# Patient Record
Sex: Female | Born: 1985 | Race: Black or African American | Hispanic: No | Marital: Single | Smoking: Current every day smoker
Health system: Southern US, Community
[De-identification: ages and names within clinical notes are randomized; demographics above are authoritative.]

---

## 2015-07-02 ENCOUNTER — Encounter (HOSPITAL_COMMUNITY): Payer: Self-pay | Admitting: Emergency Medicine

## 2015-07-02 ENCOUNTER — Emergency Department (HOSPITAL_COMMUNITY)
Admission: EM | Admit: 2015-07-02 | Discharge: 2015-07-02 | Disposition: A | Discharge status: Home / Self Care | Payer: Medicaid Other | Attending: Emergency Medicine | Admitting: Emergency Medicine

## 2015-07-02 DIAGNOSIS — F17200 Nicotine dependence, unspecified, uncomplicated: Secondary | ICD-10-CM | POA: Insufficient documentation

## 2015-07-02 DIAGNOSIS — H9202 Otalgia, left ear: Secondary | ICD-10-CM | POA: Diagnosis present

## 2015-07-02 DIAGNOSIS — H6092 Unspecified otitis externa, left ear: Secondary | ICD-10-CM

## 2015-07-02 MED ORDER — OFLOXACIN 0.3 % OT SOLN: 10.0000 [drp] | Freq: Two times a day (BID) | OTIC | Status: DC

## 2015-07-02 NOTE — Discharge Instructions (Signed)
Please read and follow all provided instructions.  Your diagnoses today include:  1. Otitis externa, left    Tests performed today include:  Vital signs. See below for your results today.   Medications prescribed:   Take as prescribed   Home care instructions:  Follow any educational materials contained in this packet.  Follow-up instructions: Please follow-up with your primary care provider in the next week for further evaluation of symptoms and treatment   Return instructions:   Please return to the Emergency Department if you do not get better, if you get worse, or new symptoms OR  - Fever (temperature greater than 101.1F)  - Bleeding that does not stop with holding pressure to the area    -Severe pain (please note that you may be more sore the day after your accident)  - Chest Pain  - Difficulty breathing  - Severe nausea or vomiting  - Inability to tolerate food and liquids  - Passing out  - Skin becoming red around your wounds  - Change in mental status (confusion or lethargy)  - New numbness or weakness     Please return if you have any other emergent concerns.  Additional Information:  Your vital signs today were: BP 143/94 mmHg   Pulse 72   Temp(Src) 98.1 F (36.7 C) (Oral)   Resp 16   Ht 5\' 9"  (1.753 m)   Wt 91.627 kg   BMI 29.82 kg/m2   SpO2 96%   LMP 06/18/2015 (Approximate) If your blood pressure (BP) was elevated above 135/85 this visit, please have this repeated by your doctor within one month. ---------------

## 2015-07-02 NOTE — ED Provider Notes (Signed)
CSN: [id number]     Arrival date & time 07/02/15  0631 History   None    Chief Complaint  Patient presents with  . Otalgia   (Consider location/radiation/quality/duration/timing/severity/associated sxs/prior Treatment) HPI 30 y.o. female presents to the Emergency Department today complaining of left ear pain since yesterday. Notes pain is 10/10 and sharp. No fevers. No N/V/D. Has tried OTC Ibuprofen with relief. No N/V/D. No Cp/SOB/ABD pain. No decrease in hearing. No other symptoms noted.   History reviewed. No pertinent past medical history. History reviewed. No pertinent past surgical history. No family history on file. Social History  Substance Use Topics  . Smoking status: Current Every Day Smoker  . Smokeless tobacco: None  . Alcohol Use: No   OB History    No data available     Review of Systems ROS reviewed and all are negative for acute change except as noted in the HPI.  Allergies  Review of patient's allergies indicates no known allergies.  Home Medications   Prior to Admission medications   Not on File   BP 143/94 mmHg  Pulse 72  Temp(Src) 98.1 F (36.7 C) (Oral)  Resp 16  Ht 5\' 9"  (1.753 m)  Wt 91.627 kg  BMI 29.82 kg/m2  SpO2 96%  LMP 06/18/2015 (Approximate) Physical Exam  Constitutional: She is oriented to person, place, and time. She appears well-developed and well-nourished.  HENT:  Head: Normocephalic and atraumatic.  Left Ear: Hearing and tympanic membrane normal. There is swelling and tenderness. No drainage. Tympanic membrane is not injected, not scarred, not perforated and not erythematous.  Otitis Externa noted   Eyes: EOM are normal.  Cardiovascular: Normal rate and regular rhythm.   Pulmonary/Chest: Effort normal.  Abdominal: Soft.  Musculoskeletal: Normal range of motion.  Neurological: She is alert and oriented to person, place, and time.  Skin: Skin is warm and dry.  Psychiatric: She has a normal mood and affect. Her behavior is  normal. Thought content normal.  Nursing note and vitals reviewed.  ED Course  Procedures (including critical care time) Labs Review Labs Reviewed - No data to display  Imaging Review No results found. I have personally reviewed and evaluated these images and lab results as part of my medical decision-making.   EKG Interpretation None      MDM  I have reviewed the relevant previous healthcare records. I obtained HPI from historian.  ED Course:  Assessment: Pt is a 29yF who presents with Otitis Externa. On exam, pt in NAD. Nontoxic/nonseptic appearing. VSS. Afebrile. TMs clear. No Mastoid tenderness. Plan is to DC home with otic abx drops and symptomatic tx. At time of discharge, Patient is in no acute distress. Vital Signs are stable. Patient is able to ambulate. Patient able to tolerate PO.   Disposition/Plan:  Dc Home Additional Verbal discharge instructions given and discussed with patient.  Pt Instructed to f/u with PCP in the next 48 hours for evaluation and treatment of symptoms. Return precautions given Pt acknowledges and agrees with plan  Supervising Physician No att. providers found   Final diagnoses:  Otitis externa, left     Audry Piliyler Christobal Morado, PA-C 07/02/15 0751  Melene Planan Floyd, DO 07/02/15 1459

## 2015-07-02 NOTE — ED Notes (Signed)
Pt. reports left ear ache onset yesterday , denies injury , no drainage or hearing loss.  

## 2015-07-26 ENCOUNTER — Encounter (HOSPITAL_COMMUNITY): Payer: Self-pay | Admitting: Emergency Medicine

## 2015-07-26 ENCOUNTER — Emergency Department (HOSPITAL_COMMUNITY)
Admission: EM | Admit: 2015-07-26 | Discharge: 2015-07-26 | Disposition: A | Discharge status: Home / Self Care | Payer: Medicaid Other | Attending: Emergency Medicine | Admitting: Emergency Medicine

## 2015-07-26 DIAGNOSIS — H6505 Acute serous otitis media, recurrent, left ear: Secondary | ICD-10-CM | POA: Diagnosis not present

## 2015-07-26 DIAGNOSIS — H9202 Otalgia, left ear: Secondary | ICD-10-CM | POA: Diagnosis present

## 2015-07-26 DIAGNOSIS — F17200 Nicotine dependence, unspecified, uncomplicated: Secondary | ICD-10-CM | POA: Diagnosis not present

## 2015-07-26 MED ORDER — AMOXICILLIN 500 MG PO CAPS: 500.0000 mg | ORAL_CAPSULE | Freq: Three times a day (TID) | ORAL | Status: DC

## 2015-07-26 NOTE — ED Notes (Signed)
Patient able to ambulate independently  

## 2015-07-26 NOTE — Discharge Instructions (Signed)

## 2015-07-26 NOTE — ED Provider Notes (Signed)
CSN: [id number]     Arrival date & time 07/26/15  0002 History   First MD Initiated Contact with Patient 07/26/15 0107     Chief Complaint  Patient presents with  . Otalgia     (Consider location/radiation/quality/duration/timing/severity/associated sxs/prior Treatment) Patient is a 30 y.o. female presenting with ear pain. The history is provided by the patient. No language interpreter was used.  Otalgia Location:  Left Quality:  Aching and pressure Severity:  Moderate Onset quality:  Gradual Duration:  2 days Timing:  Intermittent Progression:  Worsening Chronicity:  Recurrent Ineffective treatments:  OTC medications Associated symptoms: no congestion, no cough, no fever and no rhinorrhea     History reviewed. No pertinent past medical history. History reviewed. No pertinent past surgical history. No family history on file. Social History  Substance Use Topics  . Smoking status: Current Every Day Smoker  . Smokeless tobacco: None  . Alcohol Use: No   OB History    No data available     Review of Systems  Constitutional: Negative for fever.  HENT: Positive for ear pain. Negative for congestion and rhinorrhea.   Respiratory: Negative for cough.   All other systems reviewed and are negative.     Allergies  Review of patient's allergies indicates no known allergies.  Home Medications   Prior to Admission medications   Medication Sig Start Date End Date Taking? Authorizing Provider  ofloxacin (FLOXIN) 0.3 % otic solution Place 10 drops into the left ear 2 (two) times daily. For 7 days 07/02/15   Audry Piliyler Mohr, PA-C   BP 132/85 mmHg  Pulse 89  Temp(Src) 98.2 F (36.8 C) (Oral)  Resp 16  Ht 5\' 9"  (1.753 m)  Wt 91.315 kg  BMI 29.72 kg/m2  SpO2 100%  LMP 07/18/2015 Physical Exam  Constitutional: She is oriented to person, place, and time. She appears well-developed and well-nourished.  HENT:  Head: Normocephalic.  Right Ear: Tympanic membrane normal.  Left  Ear: Tympanic membrane is bulging.  Eyes: Pupils are equal, round, and reactive to light.  Neck: Normal range of motion. Neck supple.  Cardiovascular: Normal rate and regular rhythm.   Pulmonary/Chest: Effort normal and breath sounds normal.  Abdominal: Soft.  Musculoskeletal: She exhibits no edema or tenderness.  Lymphadenopathy:    She has no cervical adenopathy.  Neurological: She is alert and oriented to person, place, and time.  Skin: Skin is warm and dry.  Psychiatric: She has a normal mood and affect.  Nursing note and vitals reviewed.   ED Course  Procedures (including critical care time) Labs Review Labs Reviewed - No data to display  Imaging Review No results found. I have personally reviewed and evaluated these images and lab results as part of my medical decision-making.   EKG Interpretation None      MDM   Final diagnoses:  None   Pt presenting with otitis media. Pt afebrile in NAD. Exam not concerning for mastoiditis or cellulitis. Discharge with amoxicillin  Advised follow up  in 2-3 days if no improvement.  Return precautions discussed. Pt appears safe for discharge.     Felicie Mornavid Amadeus Oyama, NP 07/26/15 [phone]  Shon Batonourtney F Horton, MD 07/26/15 2255

## 2015-07-26 NOTE — ED Notes (Signed)
Pt. reports left earache onset yesterday with mild drainage , denies fever or hearing loss.

## 2015-07-29 ENCOUNTER — Encounter (HOSPITAL_COMMUNITY): Payer: Self-pay | Admitting: Emergency Medicine

## 2015-07-29 ENCOUNTER — Ambulatory Visit (HOSPITAL_COMMUNITY)
Admission: EM | Admit: 2015-07-29 | Discharge: 2015-07-29 | Disposition: A | Discharge status: Home / Self Care | Payer: Medicaid Other | Attending: Family Medicine | Admitting: Family Medicine

## 2015-07-29 DIAGNOSIS — H9202 Otalgia, left ear: Secondary | ICD-10-CM

## 2015-07-29 DIAGNOSIS — H6692 Otitis media, unspecified, left ear: Secondary | ICD-10-CM

## 2015-07-29 MED ORDER — ACETAMINOPHEN-CODEINE #3 300-30 MG PO TABS: 1.0000 | ORAL_TABLET | Freq: Four times a day (QID) | ORAL | Status: AC | PRN

## 2015-07-29 NOTE — ED Notes (Signed)
Patient reports ear issue and pain for 3 weeks.  Patient has been seen at [facility] emergency department for the same and no improvement per patient and latest symptoms is visible swelling to left side of head.

## 2015-07-29 NOTE — Discharge Instructions (Signed)
Otitis Media, Adult °Otitis media is redness, soreness, and inflammation of the middle ear. Otitis media may be caused by allergies or, most commonly, by infection. Often it occurs as a complication of the common cold. °SIGNS AND SYMPTOMS °Symptoms of otitis media may include: °· Earache. °· Fever. °· Ringing in your ear. °· Headache. °· Leakage of fluid from the ear. °DIAGNOSIS °To diagnose otitis media, your health care provider will examine your ear with an otoscope. This is an instrument that allows your health care provider to see into your ear in order to examine your eardrum. Your health care provider also will ask you questions about your symptoms. °TREATMENT  °Typically, otitis media resolves on its own within 3-5 days. Your health care provider may prescribe medicine to ease your symptoms of pain. If otitis media does not resolve within 5 days or is recurrent, your health care provider may prescribe antibiotic medicines if he or she suspects that a bacterial infection is the cause. °HOME CARE INSTRUCTIONS  °· If you were prescribed an antibiotic medicine, finish it all even if you start to feel better. °· Take medicines only as directed by your health care provider. °· Keep all follow-up visits as directed by your health care provider. °SEEK MEDICAL CARE IF: °· You have otitis media only in one ear, or bleeding from your nose, or both. °· You notice a lump on your neck. °· You are not getting better in 3-5 days. °· You feel worse instead of better. °SEEK IMMEDIATE MEDICAL CARE IF:  °· You have pain that is not controlled with medicine. °· You have swelling, redness, or pain around your ear or stiffness in your neck. °· You notice that part of your face is paralyzed. °· You notice that the bone behind your ear (mastoid) is tender when you touch it. °MAKE SURE YOU:  °· Understand these instructions. °· Will watch your condition. °· Will get help right away if you are not doing well or get worse. °  °This  information is not intended to replace advice given to you by your health care provider. Make sure you discuss any questions you have with your health care provider. °  °Document Released: 12/06/2003 Document Revised: 03/23/2014 Document Reviewed: 09/27/2012 °Elsevier Interactive Patient Education ©2016 [employer] ° °Earache °An earache, also called otalgia, can be caused by many things. Pain from an earache can be sharp, dull, or burning. The pain may be temporary or constant. °Earaches can be caused by problems with the ear, such as infection in either the middle ear or the ear canal, injury, impacted ear wax, middle ear pressure, or a foreign body in the ear. Ear pain can also result from problems in other areas. This is called referred pain. For example, pain can come from a sore throat, a tooth infection, or problems with the jaw or the joint between the jaw and the skull (temporomandibular joint, or TMJ). °The cause of an earache is not always easy to identify. Watchful waiting may be appropriate for some earaches until a clear cause of the pain can be found. °HOME CARE INSTRUCTIONS °Watch your condition for any changes. The following actions may help to lessen any discomfort that you are feeling: °· Take medicines only as directed by your health care provider. This includes ear drops. °· Apply ice to your outer ear to help reduce pain. °¨ Put ice in a plastic bag. °¨ Place a towel between your skin and the bag. °¨ Leave the   ice on for 20 minutes, 2-3 times per day. °· Do not put anything in your ear other than medicine that is prescribed by your health care provider. °· Try resting in an upright position instead of lying down. This may help to reduce pressure in the middle ear and relieve pain. °· Chew gum if it helps to relieve your ear pain. °· Control any allergies that you have. °· Keep all follow-up visits as directed by your health care provider. This is important. °SEEK MEDICAL CARE IF: °· Your pain  does not improve within 2 days. °· You have a fever. °· You have new or worsening symptoms. °SEEK IMMEDIATE MEDICAL CARE IF: °· You have a severe headache. °· You have a stiff neck. °· You have difficulty swallowing. °· You have redness or swelling behind your ear. °· You have drainage from your ear. °· You have hearing loss. °· You feel dizzy. °  °This information is not intended to replace advice given to you by your health care provider. Make sure you discuss any questions you have with your health care provider. °  °Document Released: 10/18/2003 Document Revised: 03/23/2014 Document Reviewed: 10/01/2013 °Elsevier Interactive Patient Education ©2016 [employer] ° °

## 2015-07-29 NOTE — ED Provider Notes (Signed)
CSN: [id number]     Arrival date & time 07/29/15  1425 History   First MD Initiated Contact with Patient 07/29/15 1550     Chief Complaint  Patient presents with  . Otalgia   (Consider location/radiation/quality/duration/timing/severity/associated sxs/prior Treatment) HPI History obtained from patient:  Pt presents with the cc of: Left ear pain Duration of symptoms: 3 weeks Treatment prior to arrival: Treated once with eardrops and now amoxicillin, ibuprofen Context: Earache onset 3 weeks ago was seen in the emergency department diagnoses with swimmer's ear and was given drops that Better and now she has here pain again was seen in the emergency department Friday night was diagnosed with otitis media and was given amoxicillin states pain is no better. States she has been taking ibuprofen without any significant symptom relief. Other symptoms include: Pain in the Pain score: 6 FAMILY HISTORY: No family history of cancer. SOCIAL HISTORY: Current smoker  History reviewed. No pertinent past medical history. History reviewed. No pertinent past surgical history. No family history on file. Social History  Substance Use Topics  . Smoking status: Current Every Day Smoker  . Smokeless tobacco: None  . Alcohol Use: No   OB History    No data available     Review of Systems ROS +'ve left earache  Denies: HEADACHE, NAUSEA, ABDOMINAL PAIN, CHEST PAIN, CONGESTION, DYSURIA, SHORTNESS OF BREATH  Allergies  Review of patient's allergies indicates no known allergies.  Home Medications   Prior to Admission medications   Medication Sig Start Date End Date Taking? Authorizing Provider  ibuprofen (ADVIL,MOTRIN) 200 MG tablet Take 200 mg by mouth every 6 (six) hours as needed.   Yes Historical Provider, MD  acetaminophen-codeine (TYLENOL #3) 300-30 MG tablet Take 1-2 tablets by mouth every 6 (six) hours as needed for moderate pain. 07/29/15   Tharon Aquas, PA  amoxicillin (AMOXIL) 500 MG  capsule Take 1 capsule (500 mg total) by mouth 3 (three) times daily. 07/26/15   Felicie Morn, NP  ofloxacin (FLOXIN) 0.3 % otic solution Place 10 drops into the left ear 2 (two) times daily. For 7 days 07/02/15   Audry Pili, PA-C   Meds Ordered and Administered this Visit  Medications - No data to display  BP 130/77 mmHg  Pulse 96  Temp(Src) 98.2 F (36.8 C) (Oral)  Resp 12  SpO2 100%  LMP 07/18/2015 No data found.   Physical Exam NURSES NOTES AND VITAL SIGNS REVIEWED. CONSTITUTIONAL: Well developed, well nourished, no acute distress HEENT: normocephalic, atraumatic left ear there is effusion noted behind the membrane. There is some bulging noted minimal redness. No motion. Right ear is clear and normal. EYES: Conjunctiva normal NECK:normal ROM, supple, no adenopathy PULMONARY:No respiratory distress, normal effort ABDOMINAL: Soft, ND, NT BS+, No CVAT MUSCULOSKELETAL: Normal ROM of all extremities,  SKIN: warm and dry without rash PSYCHIATRIC: Mood and affect, behavior are normal  ED Course  Procedures (including critical care time)  Labs Review Labs Reviewed - No data to display  Imaging Review No results found.   Visual Acuity Review  Right Eye Distance:   Left Eye Distance:   Bilateral Distance:    Right Eye Near:   Left Eye Near:    Bilateral Near:      Prescription for Tylenol No. 3 No. 30 provided   MDM   1. Otalgia of left ear   2. Acute left otitis media, recurrence not specified, unspecified otitis media type     Patient is reassured that there  are no issues that require transfer to higher level of care at this time or additional tests. Patient is advised to continue home symptomatic treatment. Patient is advised that if there are new or worsening symptoms to attend the emergency department, contact primary care provider, or return to UC. Instructions of care provided discharged home in stable condition.    THIS NOTE WAS GENERATED USING A VOICE  RECOGNITION SOFTWARE PROGRAM. ALL REASONABLE EFFORTS  WERE MADE TO PROOFREAD THIS DOCUMENT FOR ACCURACY.  I have verbally reviewed the discharge instructions with the patient. A printed AVS was given to the patient.  All questions were answered prior to discharge.      Tharon AquasFrank C Patrick, PA 07/29/15 1627

## 2015-08-05 ENCOUNTER — Encounter (HOSPITAL_COMMUNITY): Payer: Self-pay

## 2015-08-05 ENCOUNTER — Emergency Department (HOSPITAL_COMMUNITY)
Admission: EM | Admit: 2015-08-05 | Discharge: 2015-08-05 | Disposition: A | Discharge status: Home / Self Care | Payer: Medicaid Other | Attending: Emergency Medicine | Admitting: Emergency Medicine

## 2015-08-05 DIAGNOSIS — T43615A Adverse effect of caffeine, initial encounter: Secondary | ICD-10-CM | POA: Insufficient documentation

## 2015-08-05 DIAGNOSIS — R51 Headache: Secondary | ICD-10-CM | POA: Diagnosis present

## 2015-08-05 DIAGNOSIS — F17210 Nicotine dependence, cigarettes, uncomplicated: Secondary | ICD-10-CM | POA: Diagnosis not present

## 2015-08-05 DIAGNOSIS — G4440 Drug-induced headache, not elsewhere classified, not intractable: Secondary | ICD-10-CM | POA: Diagnosis not present

## 2015-08-05 DIAGNOSIS — Z792 Long term (current) use of antibiotics: Secondary | ICD-10-CM | POA: Insufficient documentation

## 2015-08-05 DIAGNOSIS — T3995XA Adverse effect of unspecified nonopioid analgesic, antipyretic and antirheumatic, initial encounter: Secondary | ICD-10-CM

## 2015-08-05 MED ORDER — DEXAMETHASONE SODIUM PHOSPHATE 10 MG/ML IJ SOLN: 10.0000 mg | Freq: Once | INTRAMUSCULAR | Status: DC

## 2015-08-05 MED ORDER — METOCLOPRAMIDE HCL 10 MG PO TABS
10.0000 mg | ORAL_TABLET | Freq: Once | ORAL | Status: AC
  Administered 2015-08-05: 10 mg via ORAL
  Filled 2015-08-05: qty 1

## 2015-08-05 MED ORDER — METOCLOPRAMIDE HCL 5 MG/ML IJ SOLN: 10.0000 mg | Freq: Once | INTRAMUSCULAR | Status: DC

## 2015-08-05 MED ORDER — DIPHENHYDRAMINE HCL 25 MG PO CAPS
50.0000 mg | ORAL_CAPSULE | Freq: Once | ORAL | Status: AC
  Administered 2015-08-05: 50 mg via ORAL
  Filled 2015-08-05: qty 2

## 2015-08-05 MED ORDER — DIPHENHYDRAMINE HCL 50 MG/ML IJ SOLN: 25.0000 mg | Freq: Once | INTRAMUSCULAR | Status: DC

## 2015-08-05 MED ORDER — IBUPROFEN 600 MG PO TABS: 600.0000 mg | ORAL_TABLET | Freq: Four times a day (QID) | ORAL | Status: AC | PRN

## 2015-08-05 MED ORDER — IBUPROFEN 800 MG PO TABS
800.0000 mg | ORAL_TABLET | Freq: Once | ORAL | Status: AC
  Administered 2015-08-05: 800 mg via ORAL
  Filled 2015-08-05: qty 1

## 2015-08-05 MED ORDER — KETOROLAC TROMETHAMINE 30 MG/ML IJ SOLN: 30.0000 mg | Freq: Once | INTRAMUSCULAR | Status: DC

## 2015-08-05 NOTE — ED Notes (Signed)
MD at bedside. 

## 2015-08-05 NOTE — ED Provider Notes (Signed)
CSN: [id number]     Arrival date & time 08/05/15  [id number] History   First MD Initiated Contact with Patient 08/05/15 [phone]     Chief Complaint  Patient presents with  . Headache     (Consider location/radiation/quality/duration/timing/severity/associated sxs/prior Treatment) Patient is a 30 y.o. female presenting with headaches. The history is provided by the patient.  Headache Pain location:  L temporal Quality: tightness. Radiates to:  Does not radiate Onset quality:  Gradual Duration:  2 days Timing:  Constant Progression:  Waxing and waning Chronicity:  New Similar to prior headaches: no   Context: loud noise   Context: not activity   Relieved by:  Nothing Worsened by:  Nothing Ineffective treatments:  None tried Associated symptoms: no cough, no fever, no hearing loss, no loss of balance and no vomiting     History reviewed. No pertinent past medical history. History reviewed. No pertinent past surgical history. History reviewed. No pertinent family history. Social History  Substance Use Topics  . Smoking status: Current Every Day Smoker  . Smokeless tobacco: None  . Alcohol Use: No   OB History    No data available     Review of Systems  Constitutional: Negative for fever.  HENT: Negative for hearing loss.   Respiratory: Negative for cough.   Gastrointestinal: Negative for vomiting.  Neurological: Positive for headaches. Negative for loss of balance.  All other systems reviewed and are negative.     Allergies  Review of patient's allergies indicates no known allergies.  Home Medications   Prior to Admission medications   Medication Sig Start Date End Date Taking? Authorizing Provider  acetaminophen-codeine (TYLENOL #3) 300-30 MG tablet Take 1-2 tablets by mouth every 6 (six) hours as needed for moderate pain. 07/29/15   Tharon AquasFrank C Patrick, PA  amoxicillin (AMOXIL) 500 MG capsule Take 1 capsule (500 mg total) by mouth 3 (three) times daily. 07/26/15   Felicie Mornavid  Smith, NP  ibuprofen (ADVIL,MOTRIN) 200 MG tablet Take 200 mg by mouth every 6 (six) hours as needed.    Historical Provider, MD  ofloxacin (FLOXIN) 0.3 % otic solution Place 10 drops into the left ear 2 (two) times daily. For 7 days 07/02/15   Audry Piliyler Mohr, PA-C   BP 146/95 mmHg  Pulse 102  Temp(Src) 98.3 F (36.8 C) (Oral)  Resp 20  Ht 5\' 9"  (1.753 m)  Wt 202 lb (91.627 kg)  BMI 29.82 kg/m2  SpO2 100%  LMP 07/18/2015 Physical Exam  Constitutional: She is oriented to person, place, and time. She appears well-developed and well-nourished. No distress.  HENT:  Head: Normocephalic.  Eyes: Conjunctivae are normal.  Neck: Neck supple. No tracheal deviation present.  Cardiovascular: Normal rate and regular rhythm.   Pulmonary/Chest: Effort normal. No respiratory distress.  Abdominal: Soft. She exhibits no distension.  Neurological: She is alert and oriented to person, place, and time.  Normal finger to nose testing and rapid alternating movement   Skin: Skin is warm and dry.  Psychiatric: She has a normal mood and affect.  Vitals reviewed.   ED Course  Procedures (including critical care time) Labs Review Labs Reviewed - No data to display  Imaging Review No results found. I have personally reviewed and evaluated these images and lab results as part of my medical decision-making.   EKG Interpretation None      MDM   Final diagnoses:  Analgesic rebound headache    30 y.o. female presents with Headache after having a left otitis  media and otitis externa infection diagnosed earlier this month. She's been taking Goody powders, generic Tylenol, aspirin and caffeine-containing medication and Tylenol with codeine which have seemingly exacerbated her symptoms. I discussed the strong possibility of rebound headache in setting of codeine and caffeine use for headache control. Neurologically intact with no red flags. Provided a migraine cocktail here for help with immediately relieving  pain and will plan for monotherapy with ibuprofen at home for any continuing symptoms. She is to abstain from Goody's powders and medications with caffeine or narcotic agents in them.    Lyndal Pulley, MD 08/05/15 [phone]

## 2015-08-05 NOTE — ED Notes (Signed)
Pt complains of a headache since Saturday, she had an earache infection prior to that

## 2015-08-05 NOTE — ED Notes (Signed)
Discharge instructions, follow up care, and rx x1 reviewed with patient. Patient verbalized understanding. 

## 2015-08-05 NOTE — Discharge Instructions (Signed)
Analgesic Rebound Headaches An analgesic rebound headache is a headache that returns after pain medicine (analgesic) that was taken to treat the initial headache wears off. People who suffer from tension, migraine, or cluster headaches are at risk for developing rebound headaches. Any type of primary headache can return as a rebound headache if you regularly take analgesics more than three times a week. If the cycle of rebound headaches continues, they become chronic daily headaches.  CAUSES Analgesics frequently associated with this problem include common over-the-counter medicines like aspirin, ibuprofen, acetaminophen, sinus relief medicines, and other medicines that contain caffeine. Narcotic pain medicines are also a common cause of rebound headaches.  SIGNS AND SYMPTOMS The symptoms of rebound headaches are the same as the symptoms of your initial headache. Symptoms of specific types of headaches include: Tension headache  Pressure around the head.  Dull, aching head pain.  Pain felt over the front and sides of the head.  Tenderness in the muscles of the head, neck and shoulders. Migraine Headache  Pulsing or throbbing pain on one or both sides of the head.  Severe pain that interferes with daily activities.  Pain that is worsened by physical activity.  Nausea, vomiting, or both.  Pain with exposure to bright light, loud noises, or strong smells.  General sensitivity to bright light, loud noises, or strong smells.  Visual changes.  Numbness of one or both arms. Cluster Headaches  Severe pain that begins in or around one eye or temple.  Redness in the eye on the same side as the pain.  Droopy or swollen eyelid.  One-sided head pain.  Nausea.  Runny nose.  Sweaty, pale facial skin.  Restlessness. DIAGNOSIS  Analgesic rebound headaches are diagnosed by reviewing your medical history. This includes the nature of your initial headaches, as well as the type of pain  medicines you have been using to treat your headaches and how often you take them. TREATMENT Discontinuing frequent use of the analgesic medicine will typically reduce the frequency of the rebound episodes. This may initially worsen your headaches but eventually the pain should become more manageable, less frequent, and less severe.  Seeing a headache specialists may helpful. He or she may be able to help you manage your headaches and to make sure there is not another cause of the headaches. Alternative methods of stress relief such as acupuncture, counseling, biofeedback, and massage may also be helpful. Talk with your health care provider about which alternative treatments might be good for you. HOME CARE INSTRUCTIONS Stopping the regular use of pain medicine can be difficult. Follow your health care provider's instructions carefully. Keep all of your appointments. Avoid triggers that are known to cause your primary headaches. SEEK MEDICAL CARE IF: You continue to experience headaches after following your health care provider's recommended treatments. SEEK IMMEDIATE MEDICAL CARE IF:  You develop new headache pain.  You develop headache pain that is different than what you have experienced in the past.  You develop numbness or tingling in your arms or legs.  You develop changes in your speech or vision. MAKE SURE YOU:  Understand these instructions.  Will watch your child's condition.  Will get help right away if your child is not doing well or gets worse.   This information is not intended to replace advice given to you by your health care provider. Make sure you discuss any questions you have with your health care provider.   Document Released: 05/23/2003 Document Revised: 03/23/2014 Document Reviewed: 09/15/2012 Elsevier  Interactive Patient Education 2016 Elsevier Inc.  Do not take caffeine-containing compounds or any [employer] powders.

## 2015-08-07 ENCOUNTER — Emergency Department (HOSPITAL_COMMUNITY)
Admission: EM | Admit: 2015-08-07 | Discharge: 2015-08-07 | Disposition: A | Discharge status: Home / Self Care | Payer: Medicaid Other | Attending: Emergency Medicine | Admitting: Emergency Medicine

## 2015-08-07 ENCOUNTER — Encounter (HOSPITAL_COMMUNITY): Payer: Self-pay

## 2015-08-07 DIAGNOSIS — F17210 Nicotine dependence, cigarettes, uncomplicated: Secondary | ICD-10-CM | POA: Diagnosis not present

## 2015-08-07 DIAGNOSIS — G44209 Tension-type headache, unspecified, not intractable: Secondary | ICD-10-CM | POA: Insufficient documentation

## 2015-08-07 DIAGNOSIS — R51 Headache: Secondary | ICD-10-CM | POA: Diagnosis present

## 2015-08-07 DIAGNOSIS — Z791 Long term (current) use of non-steroidal anti-inflammatories (NSAID): Secondary | ICD-10-CM | POA: Insufficient documentation

## 2015-08-07 MED ORDER — METOCLOPRAMIDE HCL 10 MG PO TABS
10.0000 mg | ORAL_TABLET | Freq: Once | ORAL | Status: AC
  Administered 2015-08-07: 10 mg via ORAL
  Filled 2015-08-07: qty 1

## 2015-08-07 MED ORDER — KETOROLAC TROMETHAMINE 30 MG/ML IJ SOLN
30.0000 mg | Freq: Once | INTRAMUSCULAR | Status: AC
  Administered 2015-08-07: 30 mg via INTRAMUSCULAR
  Filled 2015-08-07: qty 1

## 2015-08-07 MED ORDER — DIPHENHYDRAMINE HCL 25 MG PO CAPS
50.0000 mg | ORAL_CAPSULE | Freq: Once | ORAL | Status: AC
  Administered 2015-08-07: 50 mg via ORAL
  Filled 2015-08-07: qty 2

## 2015-08-07 MED ORDER — DEXAMETHASONE SODIUM PHOSPHATE 10 MG/ML IJ SOLN
10.0000 mg | Freq: Once | INTRAMUSCULAR | Status: AC
  Administered 2015-08-07: 10 mg via INTRAMUSCULAR
  Filled 2015-08-07: qty 1

## 2015-08-07 NOTE — Discharge Instructions (Signed)
Please follow-up with your doctor or the [facility] in order to establish primary care. Return to ED for new or worsening symptoms.  Tension Headache A tension headache is a feeling of pain, pressure, or aching that is often felt over the front and sides of the head. The pain can be dull, or it can feel tight (constricting). Tension headaches are not normally associated with nausea or vomiting, and they do not get worse with physical activity. Tension headaches can last from 30 minutes to several days. This is the most common type of headache. CAUSES The exact cause of this condition is not known. Tension headaches often begin after stress, anxiety, or depression. Other triggers may include:  Alcohol.  Too much caffeine, or caffeine withdrawal.  Respiratory infections, such as colds, flu, or sinus infections.  Dental problems or teeth clenching.  Fatigue.  Holding your head and neck in the same position for a long period of time, such as while using a computer.  Smoking. SYMPTOMS Symptoms of this condition include:  A feeling of pressure around the head.  Dull, aching head pain.  Pain felt over the front and sides of the head.  Tenderness in the muscles of the head, neck, and shoulders. DIAGNOSIS This condition may be diagnosed based on your symptoms and a physical exam. Tests may be done, such as a CT scan or an MRI of your head. These tests may be done if your symptoms are severe or unusual. TREATMENT This condition may be treated with lifestyle changes and medicines to help relieve symptoms. HOME CARE INSTRUCTIONS Managing Pain  Take over-the-counter and prescription medicines only as told by your health care provider.  Lie down in a dark, quiet room when you have a headache.  If directed, apply ice to the head and neck area:  Put ice in a plastic bag.  Place a towel between your skin and the bag.  Leave the ice on for 20 minutes, 2-3 times  per day.  Use a heating pad or a hot shower to apply heat to the head and neck area as told by your health care provider. Eating and Drinking  Eat meals on a regular schedule.  Limit alcohol use.  Decrease your caffeine intake, or stop using caffeine. General Instructions  Keep all follow-up visits as told by your health care provider. This is important.  Keep a headache journal to help find out what may trigger your headaches. For example, write down:  What you eat and drink.  How much sleep you get.  Any change to your diet or medicines.  Try massage or other relaxation techniques.  Limit stress.  Sit up straight, and avoid tensing your muscles.  Do not use tobacco products, including cigarettes, chewing tobacco, or e-cigarettes. If you need help quitting, ask your health care provider.  Exercise regularly as told by your health care provider.  Get 7-9 hours of sleep, or the amount recommended by your health care provider. SEEK MEDICAL CARE IF:  Your symptoms are not helped by medicine.  You have a headache that is different from what you normally experience.  You have nausea or you vomit.  You have a fever. SEEK IMMEDIATE MEDICAL CARE IF:  Your headache becomes severe.  You have repeated vomiting.  You have a stiff neck.  You have a loss of vision.  You have problems with speech.  You have pain in your eye or ear.  You have muscular weakness or loss  of muscle control.  You lose your balance or you have trouble walking.  You feel faint or you pass out.  You have confusion.   This information is not intended to replace advice given to you by your health care provider. Make sure you discuss any questions you have with your health care provider.   Document Released: 03/02/2005 Document Revised: 11/21/2014 Document Reviewed: 06/25/2014 Elsevier Interactive Patient Education [employer]2016 Elsevier Inc.

## 2015-08-07 NOTE — ED Notes (Signed)
Patient c/o headache x 2 weeks and states the pain has gradually gotten worse. Patient c/o light and sound sensitivity. Patient denies N/V and blurred vision.

## 2015-08-07 NOTE — ED Notes (Addendum)
Pt states that they had hard time establishing IV access, so she is requesting PO meds for today's visit.

## 2015-08-07 NOTE — ED Provider Notes (Signed)
CSN: [id number]     Arrival date & time 08/07/15  1208 History   First MD Initiated Contact with Patient 08/07/15 1253     Chief Complaint  Patient presents with  . Headache     (Consider location/radiation/quality/duration/timing/severity/associated sxs/prior Treatment) HPI Claudia Lee is a 30 y.o. female here for evaluation of headache. Patient reports she has had an intermittent headache over the past 2 weeks. She characterizes it as a tight sensation around her whole head. She has tried ibuprofen intermittently without full relief. Reports associated phonophobia and photophobia. She denies any fevers, chills, neck pain or stiffness, vision changes, numbness, weakness, confusion or dizziness, rash. No other alleviating or aggravating factors.  History reviewed. No pertinent past medical history. History reviewed. No pertinent past surgical history. No family history on file. Social History  Substance Use Topics  . Smoking status: Current Every Day Smoker -- 0.25 packs/day    Types: Cigarettes  . Smokeless tobacco: Never Used  . Alcohol Use: No   OB History    No data available     Review of Systems A 10 point review of systems was completed and was negative except for pertinent positives and negatives as mentioned in the history of present illness     Allergies  Review of patient's allergies indicates no known allergies.  Home Medications   Prior to Admission medications   Medication Sig Start Date End Date Taking? Authorizing Provider  acetaminophen-codeine (TYLENOL #3) 300-30 MG tablet Take 1-2 tablets by mouth every 6 (six) hours as needed for moderate pain. 07/29/15  Yes Tharon AquasFrank C Patrick, PA  ibuprofen (ADVIL,MOTRIN) 600 MG tablet Take 1 tablet (600 mg total) by mouth every 6 (six) hours as needed. Patient taking differently: Take 600 mg by mouth every 6 (six) hours as needed for headache, mild pain or moderate pain.  08/05/15  Yes Lyndal Pulleyaniel Knott, MD   BP 134/87 mmHg   Pulse 97  Temp(Src) 97.8 F (36.6 C) (Oral)  Resp 18  Ht 5\' 9"  (1.753 m)  Wt 91.627 kg  BMI 29.82 kg/m2  SpO2 98%  LMP 07/18/2015 Physical Exam  Constitutional: She is oriented to person, place, and time. She appears well-developed and well-nourished. No distress.  HENT:  Head: Normocephalic and atraumatic.  Mouth/Throat: Oropharynx is clear and moist.  Eyes: Conjunctivae and EOM are normal. Pupils are equal, round, and reactive to light.  Neck: Normal range of motion.  No meningismus or nuchal rigidity  Cardiovascular: Normal rate, regular rhythm and normal heart sounds.   Pulmonary/Chest: Effort normal and breath sounds normal.  Abdominal: Soft. There is no tenderness.  Musculoskeletal: Normal range of motion. She exhibits no edema or tenderness.  Neurological: She is alert and oriented to person, place, and time.  Cranial nerves III through XII grossly intact. Motor strength is 5/5 in all 4 extremities and sensation is intact to light touch. Completes finger to nose coordination movements without difficulty. No nystagmus. Gait is baseline without ataxia.  Skin: She is not diaphoretic.    ED Course  Procedures (including critical care time) Labs Review Labs Reviewed - No data to display  Imaging Review No results found. I have personally reviewed and evaluated these images and lab results as part of my medical decision-making.   EKG Interpretation None     Meds given in ED:  Medications  ketorolac (TORADOL) 30 MG/ML injection 30 mg (30 mg Intramuscular Given 08/07/15 1337)  metoCLOPramide (REGLAN) tablet 10 mg (10 mg Oral Given 08/07/15 1337)  diphenhydrAMINE (BENADRYL) capsule 50 mg (50 mg Oral Given 08/07/15 1337)  dexamethasone (DECADRON) injection 10 mg (10 mg Intramuscular Given 08/07/15 1338)    New Prescriptions   No medications on file   Filed Vitals:   08/07/15 1215  BP: 134/87  Pulse: 97  Temp: 97.8 F (36.6 C)  TempSrc: Oral  Resp: 18  Height: 5'  9" (1.753 m)  Weight: 91.627 kg  SpO2: 98%    MDM  Pt HA treated and improved while in ED.  Presentation is like pts typical HA and non concerning for [facility], ICH, Meningitis, or temporal arteritis. Pt is afebrile with no focal neuro deficits, nuchal rigidity, or change in vision. No other red flags. Pt is to follow up with PCP to discuss prophylactic medication. Given [facility]. Pt verbalizes understanding and is agreeable with plan to dc.   Final diagnoses:  Tension-type headache, not intractable, unspecified chronicity pattern        Joycie Peek, PA-C 08/07/15 1542  Azalia Bilis, MD 08/07/15 [id number]

## 2015-08-24 ENCOUNTER — Emergency Department (HOSPITAL_COMMUNITY): Payer: Medicaid Other

## 2015-08-24 ENCOUNTER — Encounter (HOSPITAL_COMMUNITY): Payer: Self-pay | Admitting: Emergency Medicine

## 2015-08-24 ENCOUNTER — Emergency Department (HOSPITAL_COMMUNITY)
Admission: EM | Admit: 2015-08-24 | Discharge: 2015-08-24 | Disposition: A | Discharge status: Home / Self Care | Payer: Medicaid Other | Attending: Emergency Medicine | Admitting: Emergency Medicine

## 2015-08-24 DIAGNOSIS — Z79899 Other long term (current) drug therapy: Secondary | ICD-10-CM | POA: Diagnosis not present

## 2015-08-24 DIAGNOSIS — R51 Headache: Secondary | ICD-10-CM | POA: Diagnosis present

## 2015-08-24 DIAGNOSIS — F17210 Nicotine dependence, cigarettes, uncomplicated: Secondary | ICD-10-CM | POA: Diagnosis not present

## 2015-08-24 DIAGNOSIS — R519 Headache, unspecified: Secondary | ICD-10-CM

## 2015-08-24 MED ORDER — METOCLOPRAMIDE HCL 5 MG/ML IJ SOLN
10.0000 mg | Freq: Once | INTRAMUSCULAR | Status: AC
  Administered 2015-08-24: 10 mg via INTRAMUSCULAR
  Filled 2015-08-24: qty 2

## 2015-08-24 MED ORDER — KETOROLAC TROMETHAMINE 60 MG/2ML IM SOLN
60.0000 mg | Freq: Once | INTRAMUSCULAR | Status: AC
  Administered 2015-08-24: 60 mg via INTRAMUSCULAR
  Filled 2015-08-24: qty 2

## 2015-08-24 MED ORDER — DIPHENHYDRAMINE HCL 50 MG/ML IJ SOLN
50.0000 mg | Freq: Once | INTRAMUSCULAR | Status: AC
  Administered 2015-08-24: 50 mg via INTRAMUSCULAR
  Filled 2015-08-24: qty 1

## 2015-08-24 MED ORDER — METOCLOPRAMIDE HCL 10 MG PO TABS: 10.0000 mg | ORAL_TABLET | Freq: Four times a day (QID) | ORAL | Status: AC | PRN

## 2015-08-24 MED ORDER — ACETAMINOPHEN 500 MG PO TABS
1000.0000 mg | ORAL_TABLET | Freq: Once | ORAL | Status: AC
  Administered 2015-08-24: 1000 mg via ORAL
  Filled 2015-08-24: qty 2

## 2015-08-24 NOTE — ED Provider Notes (Signed)
CSN: [id number]     Arrival date & time 08/24/15  1038 History   First MD Initiated Contact with Patient 08/24/15 1115     Chief Complaint  Patient presents with  . Migraine      HPI  Pt was seen at 1125. Per pt, c/o gradual onset and persistence of intermittent "headache" for the past 3 to 4 weeks. Describes the headache as located all over her head. Has been associated with photophobia. Has been taking motrin with partial relief. Denies any change in headache over the past 3 to 4 weeks. Denies headache was sudden or maximal in onset or at any time.  Denies visual changes, no focal motor weakness, no tingling/numbness in extremities, no N/V, no fevers, no neck pain, no rash, no teeth pain, no ear pain. The symptoms have been associated with no other complaints. The patient has a significant history of similar symptoms previously, recently being evaluated for this complaint and multiple prior evals for same.       History reviewed. No pertinent past medical history.   History reviewed. No pertinent past surgical history.  Social History  Substance Use Topics  . Smoking status: Current Every Day Smoker -- 0.25 packs/day    Types: Cigarettes  . Smokeless tobacco: Never Used  . Alcohol Use: No    Review of Systems ROS: Statement: All systems negative except as marked or noted in the HPI; Constitutional: Negative for fever and chills. ; ; Eyes: Negative for eye pain, redness and discharge. ; ; ENMT: Negative for ear pain, hoarseness, nasal congestion, sinus pressure and sore throat. ; ; Cardiovascular: Negative for chest pain, palpitations, diaphoresis, dyspnea and peripheral edema. ; ; Respiratory: Negative for cough, wheezing and stridor. ; ; Gastrointestinal: Negative for nausea, vomiting, diarrhea, abdominal pain, blood in stool, hematemesis, jaundice and rectal bleeding. . ; ; Genitourinary: Negative for dysuria, flank pain and hematuria. ; ; Musculoskeletal: Negative for back pain and  neck pain. Negative for swelling and trauma.; ; Skin: Negative for pruritus, rash, abrasions, blisters, bruising and skin lesion.; ; Neuro: +headache. Negative for lightheadedness and neck stiffness. Negative for weakness, altered level of consciousness, altered mental status, extremity weakness, paresthesias, involuntary movement, seizure and syncope.      Allergies  Review of patient's allergies indicates no known allergies.  Home Medications   Prior to Admission medications   Medication Sig Start Date End Date Taking? Authorizing Provider  acetaminophen-codeine (TYLENOL #3) 300-30 MG tablet Take 1-2 tablets by mouth every 6 (six) hours as needed for moderate pain. 07/29/15  Yes Tharon AquasFrank C Patrick, PA  diphenhydrAMINE (SOMINEX) 25 MG tablet Take 25 mg by mouth at bedtime as needed for sleep.   Yes Historical Provider, MD  ibuprofen (ADVIL,MOTRIN) 600 MG tablet Take 1 tablet (600 mg total) by mouth every 6 (six) hours as needed. Patient taking differently: Take 600 mg by mouth every 6 (six) hours as needed for headache, mild pain or moderate pain.  08/05/15  Yes Lyndal Pulleyaniel Knott, MD   BP 143/91 mmHg  Pulse 91  Temp(Src) 98.5 F (36.9 C) (Oral)  Resp 16  SpO2 100%  LMP 07/18/2015 Physical Exam  1130: Physical examination:  Nursing notes reviewed; Vital signs and O2 SAT reviewed;  Constitutional: Well developed, Well nourished, Well hydrated, In no acute distress; Head:  Normocephalic, atraumatic; Eyes: EOMI, PERRL, No scleral icterus; ENMT: TM's clear bilat. +edemetous nasal turbinates bilat with clear rhinorrhea. Mouth and pharynx normal, Mucous membranes moist; Neck: Supple, Full range of  motion, No lymphadenopathy; Cardiovascular: Regular rate and rhythm, No murmur, rub, or gallop; Respiratory: Breath sounds clear & equal bilaterally, No rales, rhonchi, wheezes.  Speaking full sentences with ease, Normal respiratory effort/excursion; Chest: Nontender, Movement normal; Abdomen: Soft, Nontender,  Nondistended, Normal bowel sounds; Genitourinary: No CVA tenderness; Extremities: Pulses normal, No tenderness, No edema, No calf edema or asymmetry.; Neuro: AA&Ox3, Major CN grossly intact.  Speech clear. No gross focal motor or sensory deficits in extremities. Climbs on and off stretcher easily by herself. Gait steady. Pacing exam room.; Skin: Color normal, Warm, Dry.   ED Course  Procedures (including critical care time) Labs Review  Imaging Review I have personally reviewed and evaluated these images and lab results as part of my medical decision-making.   EKG Interpretation None      MDM  MDM Reviewed: previous chart, nursing note and vitals Interpretation: CT scan      Ct Head Wo Contrast 08/24/2015  CLINICAL DATA:  30 year old female with a history of pain left ear EXAM: CT HEAD WITHOUT CONTRAST TECHNIQUE: Contiguous axial images were obtained from the base of the skull through the vertex without intravenous contrast. COMPARISON:  None. FINDINGS: Unremarkable appearance of the calvarium without acute fracture or aggressive lesion. Unremarkable appearance of the scalp soft tissues. Unremarkable appearance of the bilateral orbits. Mastoid air cells are clear. No significant paranasal sinus disease No acute intracranial hemorrhage, midline shift, or mass effect. Gray-white differentiation is maintained, without CT evidence of acute ischemia. Unremarkable configuration of the ventricles. IMPRESSION: No CT evidence of acute intracranial abnormality. Signed, Yvone Neu. Loreta Ave, DO [facility] Radiology Electronically Signed   By: Gilmer Mor D.O.   On: 08/24/2015 12:20    1345:  Feels better after meds and wants to go home now. Has tol PO well while in the ED without N/V. Tx symptomatically, f/u PMD and Neuro MD. Dx and testing d/w pt and family.  Questions answered.  Verb understanding, agreeable to d/c home with outpt f/u.   Samuel Jester, DO 08/26/15 1644

## 2015-08-24 NOTE — Discharge Instructions (Signed)
Take over the counter tylenol and ibuprofen (OR excedrin) and benadryl, as directed on packaging, with the prescription given to you today, as needed for headache.  Keep a headache diary, as discussed.  Call your regular medical doctor on Monday to schedule a follow up appointment within the next 3 days.  Return to the Emergency Department immediately sooner if worsening.  ° °

## 2015-08-24 NOTE — ED Notes (Signed)
Pain from left ear to jaw, patient states it's a migraine. Has been seen multiple times for this and been given different prescriptions. Significant other is speaking for the patient because she's in too much pain. Pt refusing to let me look at teeth, states she's not having dental problems. Has been told and treated for an ear infection on her left side. Intact neuro assessment in triage.

## 2015-08-24 NOTE — ED Notes (Signed)
Discharge papers reviewed with patient. Pt attempted to sign but signature pad not working appropriately. Pt has a visitor at bedside that is driving patient home.

## 2015-10-31 ENCOUNTER — Emergency Department (HOSPITAL_COMMUNITY)
Admission: EM | Admit: 2015-10-31 | Discharge: 2015-10-31 | Disposition: A | Discharge status: Home / Self Care | Payer: Medicaid Other | Attending: Emergency Medicine | Admitting: Emergency Medicine

## 2015-10-31 ENCOUNTER — Encounter (HOSPITAL_COMMUNITY): Payer: Self-pay | Admitting: Emergency Medicine

## 2015-10-31 DIAGNOSIS — N912 Amenorrhea, unspecified: Secondary | ICD-10-CM | POA: Insufficient documentation

## 2015-10-31 DIAGNOSIS — F17210 Nicotine dependence, cigarettes, uncomplicated: Secondary | ICD-10-CM | POA: Diagnosis not present

## 2015-10-31 DIAGNOSIS — Z3490 Encounter for supervision of normal pregnancy, unspecified, unspecified trimester: Secondary | ICD-10-CM

## 2015-10-31 DIAGNOSIS — Z3201 Encounter for pregnancy test, result positive: Secondary | ICD-10-CM | POA: Insufficient documentation

## 2015-10-31 LAB — POC URINE PREG, ED: PREG TEST UR: POSITIVE — AB

## 2015-10-31 MED ORDER — PRENATAL COMPLETE 14-0.4 MG PO TABS: 0.4000 mg | ORAL_TABLET | Freq: Every day | ORAL | 0 refills | Status: AC

## 2015-10-31 NOTE — ED Notes (Signed)
Declined W/C at D/C and was escorted to lobby by RN. 

## 2015-10-31 NOTE — ED Triage Notes (Signed)
Patient states she is here for a pregnancy test.  No vaginal issues, just wants to confirm home pregnancy test which was positive.

## 2015-10-31 NOTE — ED Provider Notes (Signed)
[facility]-EMERGENCY DEPT Provider Note   CSN: [id number] Arrival date & time: 10/31/15  [id number]   History   Chief Complaint Chief Complaint  Patient presents with  . Amenorrhea    HPI Claudia Lee is a 30 y.o. female.  HPI  30 y.o. female presents to the Emergency Department today for pregnancy test. States that she is late for menses. LMP was last month (est date 3rd-13th). Denies vaginal complaints. No bleeding. No discharge. No abdominal pain. Does endorse some nausea. Notes that she had unprotected intercourse last week. Does nto use OCPs. Pt has had 5 pregnancies in the past. No CP/SOB. No emesis or diarrhea. No headaches. No syncope. No fevers. No other symptoms noted  History reviewed. No pertinent past medical history.  There are no active problems to display for this patient.   History reviewed. No pertinent surgical history.  OB History    No data available       Home Medications    Prior to Admission medications   Medication Sig Start Date End Date Taking? Authorizing Provider  acetaminophen-codeine (TYLENOL #3) 300-30 MG tablet Take 1-2 tablets by mouth every 6 (six) hours as needed for moderate pain. 07/29/15   Tharon AquasFrank C Patrick, PA  diphenhydrAMINE (SOMINEX) 25 MG tablet Take 25 mg by mouth at bedtime as needed for sleep.    Historical Provider, MD  ibuprofen (ADVIL,MOTRIN) 600 MG tablet Take 1 tablet (600 mg total) by mouth every 6 (six) hours as needed. Patient taking differently: Take 600 mg by mouth every 6 (six) hours as needed for headache, mild pain or moderate pain.  08/05/15   Lyndal Pulleyaniel Knott, MD  metoCLOPramide (REGLAN) 10 MG tablet Take 1 tablet (10 mg total) by mouth every 6 (six) hours as needed for nausea (or headache). 08/24/15   Samuel JesterKathleen McManus, DO    Family History No family history on file.  Social History Social History  Substance Use Topics  . Smoking status: Current Every Day Smoker    Packs/day: 0.25    Types: Cigarettes  . Smokeless  tobacco: Never Used  . Alcohol use No     Allergies   Review of patient's allergies indicates no known allergies.   Review of Systems Review of Systems  Constitutional: Negative for fever.  Gastrointestinal: Positive for nausea. Negative for abdominal pain.  Genitourinary: Negative for dysuria, pelvic pain, vaginal discharge and vaginal pain.   Physical Exam Updated Vital Signs BP 149/80 (BP Location: Right Arm)   Pulse 104   Temp 98.5 F (36.9 C) (Oral)   Resp 16   Ht 5\' 9"  (1.753 m) Comment: Simultaneous filing. User may not have seen previous data.  Wt 88.9 kg Comment: Simultaneous filing. User may not have seen previous data.  LMP 09/22/2015 (Approximate)   SpO2 99%   BMI 28.94 kg/m   Physical Exam  Constitutional: She is oriented to person, place, and time. Vital signs are normal. She appears well-developed and well-nourished.  HENT:  Head: Normocephalic.  Right Ear: Hearing normal.  Left Ear: Hearing normal.  Eyes: Conjunctivae and EOM are normal. Pupils are equal, round, and reactive to light.  Cardiovascular: Normal rate and regular rhythm.   Pulmonary/Chest: Effort normal.  Neurological: She is alert and oriented to person, place, and time.  Skin: Skin is warm and dry.  Psychiatric: She has a normal mood and affect. Her speech is normal and behavior is normal. Thought content normal.   ED Treatments / Results  Labs (all labs ordered are listed,  but only abnormal results are displayed) Labs Reviewed  POC URINE PREG, ED - Abnormal; Notable for the following:       Result Value   Preg Test, Ur POSITIVE (*)    All other components within normal limits    EKG  EKG Interpretation None       Radiology No results found.  Procedures Procedures (including critical care time)  Medications Ordered in ED Medications - No data to display   Initial Impression / Assessment and Plan / ED Course  I have reviewed the triage vital signs and the nursing  notes.  Pertinent labs & imaging results that were available during my care of the patient were reviewed by me and considered in my medical decision making (see chart for details).  Clinical Course    Final Clinical Impressions(s) / ED Diagnoses  I have reviewed and evaluated the relevant imaging studies.  I have reviewed the relevant previous healthcare records. I obtained HPI from historian.  ED Course:  Assessment: Pt is a 29yF who presents for pregnancy test. No other complaints. No ABD pain. Notes nausea.. On exam, pt in NAD. Nontoxic/nonseptic appearing. VSS. Afebrile. POC urine preg positive. Given Rx prenatal vitamins. Plan is to DC home with follow up to PCP. At time of discharge, Patient is in no acute distress. Vital Signs are stable. Patient is able to ambulate. Patient able to tolerate PO.    Disposition/Plan:  DC Home Additional Verbal discharge instructions given and discussed with patient.  Pt Instructed to f/u with OBGYN in the next week for evaluation and treatment of symptoms. Return precautions given Pt acknowledges and agrees with plan  Supervising Physician Lyndal Pulleyaniel Knott, MD   Final diagnoses:  Pregnant    New Prescriptions New Prescriptions   No medications on file       Audry Piliyler Curtez Brallier, PA-C 10/31/15 0715    Lyndal Pulleyaniel Knott, MD 11/01/15 1241

## 2015-10-31 NOTE — Discharge Instructions (Signed)
Please read and follow all provided instructions.  Your diagnoses today include:  1. Pregnant     Tests performed today include: Vital signs. See below for your results today.   Medications prescribed:  Take as prescribed   Home care instructions:  Follow any educational materials contained in this packet.  Follow-up instructions: Please follow-up with your OBGYN for further evaluation of symptoms and treatment   Return instructions:  Please return to the Emergency Department if you do not get better, if you get worse, or new symptoms OR  - Fever (temperature greater than 101.74F)  - Bleeding that does not stop with holding pressure to the area    -Severe pain (please note that you may be more sore the day after your accident)  - Chest Pain  - Difficulty breathing  - Severe nausea or vomiting  - Inability to tolerate food and liquids  - Passing out  - Skin becoming red around your wounds  - Change in mental status (confusion or lethargy)  - New numbness or weakness    Please return if you have any other emergent concerns.  Additional Information:  Your vital signs today were: BP 149/80 (BP Location: Right Arm)    Pulse 104    Temp 98.5 F (36.9 C) (Oral)    Resp 16    Ht 5\' 9"  (1.753 m) Comment: Simultaneous filing. User may not have seen previous data.   Wt 88.9 kg Comment: Simultaneous filing. User may not have seen previous data.   LMP 09/22/2015 (Approximate)    SpO2 99%    BMI 28.94 kg/m  If your blood pressure (BP) was elevated above 135/85 this visit, please have this repeated by your doctor within one month. ---------------

## 2015-12-09 ENCOUNTER — Encounter: Payer: Medicaid Other | Admitting: Obstetrics and Gynecology

## 2016-09-06 IMAGING — CT CT HEAD W/O CM
3 of 4 series · 15 of 47 positions shown, 18 images · non-contrast
Comparison: None.

CLINICAL DATA: 29-year-old female with a history of pain left ear

EXAM:
CT HEAD WITHOUT CONTRAST
TECHNIQUE: Contiguous axial images were obtained from the base of the skull
through the vertex without intravenous contrast.

[Series 2: head w/o · axial · non-contrast · 0.45mm/px · z∈[-108,+12]mm · 9 of 29 slices shown, 12 images]
[im 3/29  brain]
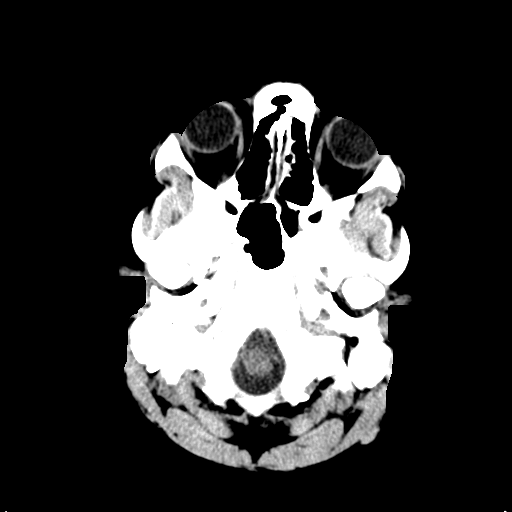
[im 3/29  bone]
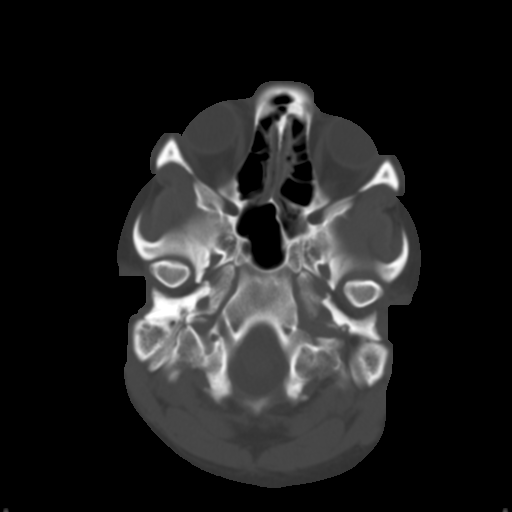
[im 7/29  brain]
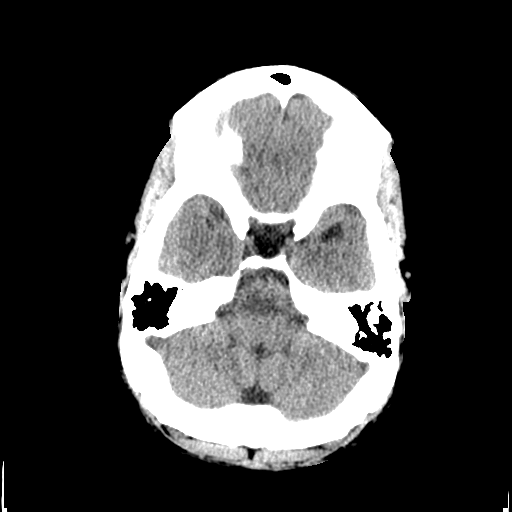
[im 9/29  brain]
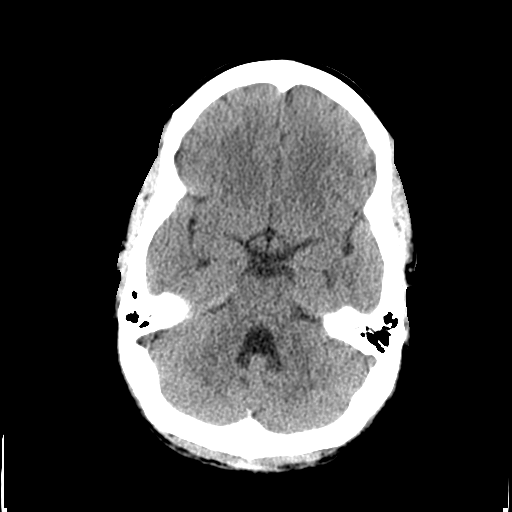
[im 13/29  brain]
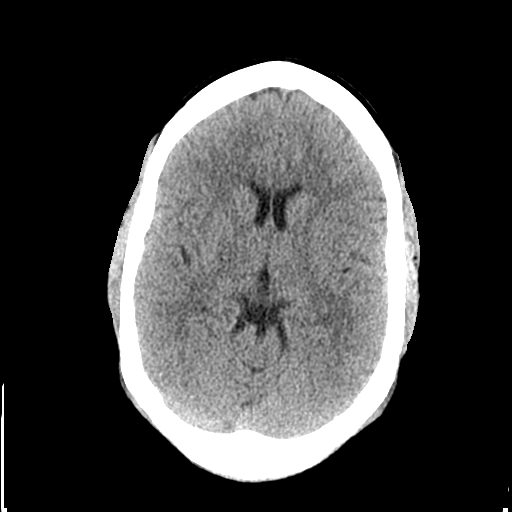
[im 15/29  brain]
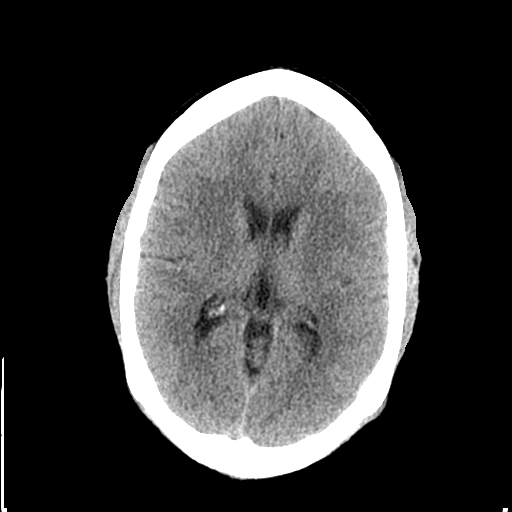
[im 15/29  bone]
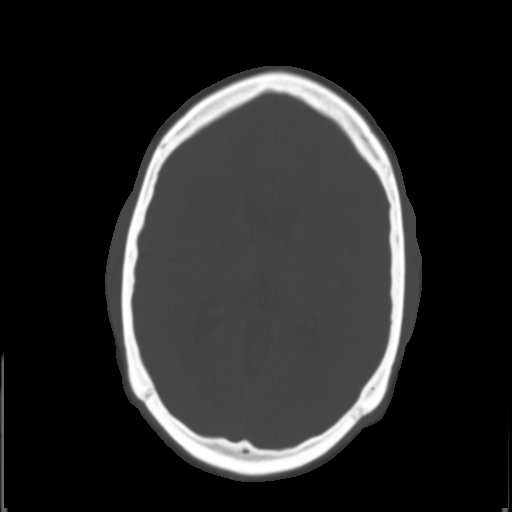
[im 17/29  brain]
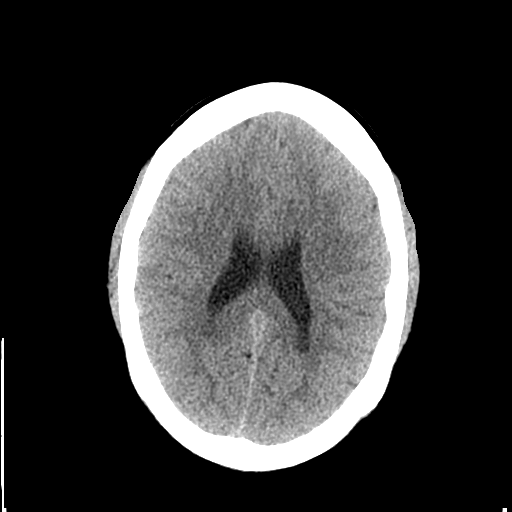
[im 21/29  brain]
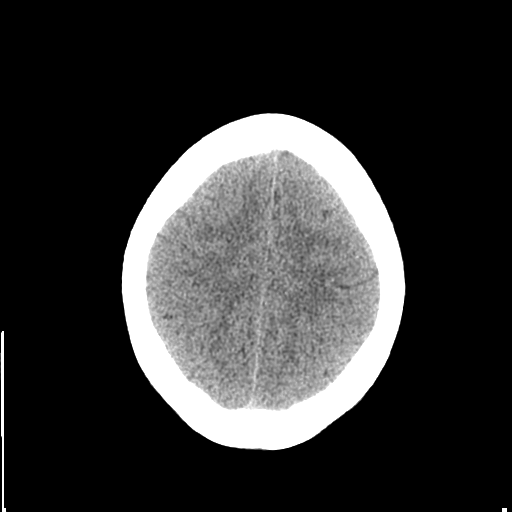
[im 23/29  brain]
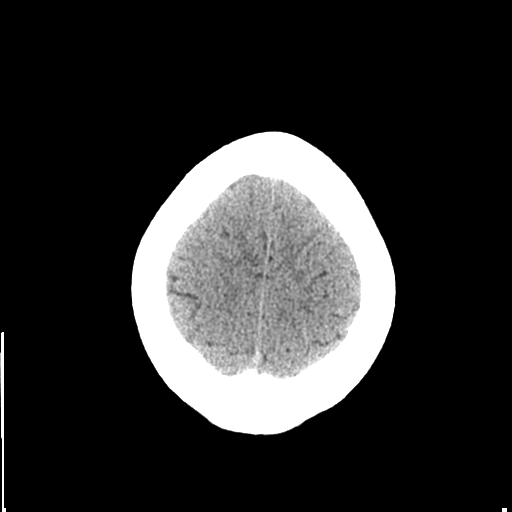
[im 27/29  brain]
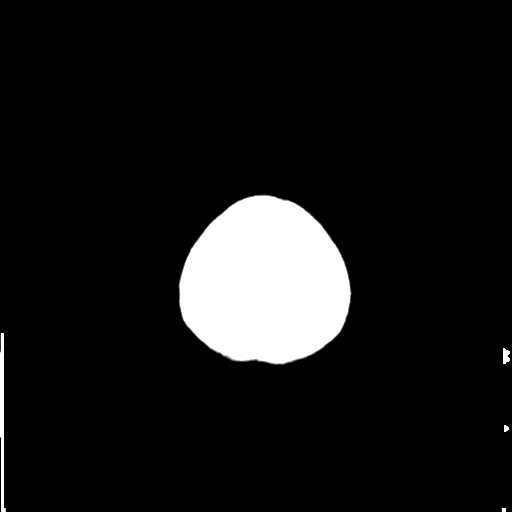
[im 27/29  bone]
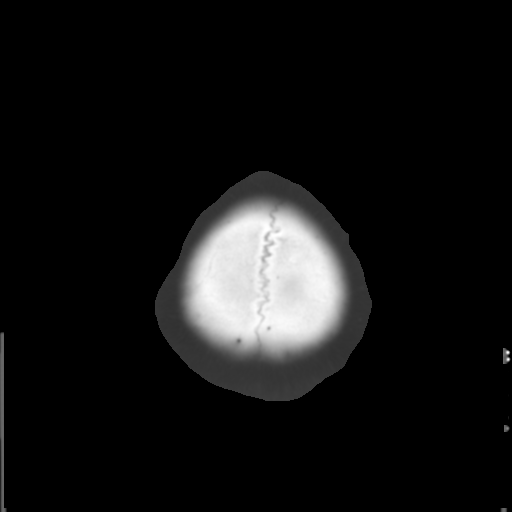

[Series 5: coronal · coronal · 0.29mm/px · 3 of 77 slices shown]
[im 26/77  brain]
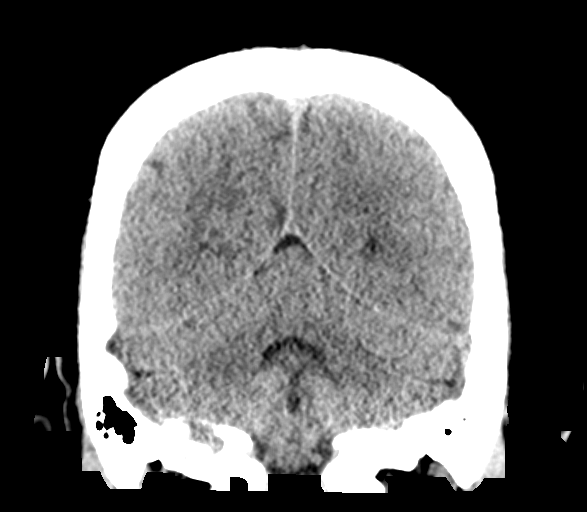
[im 34/77  brain]
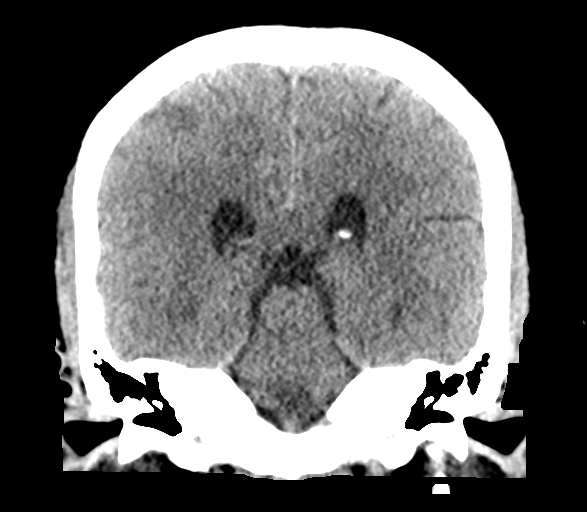
[im 43/77  brain]
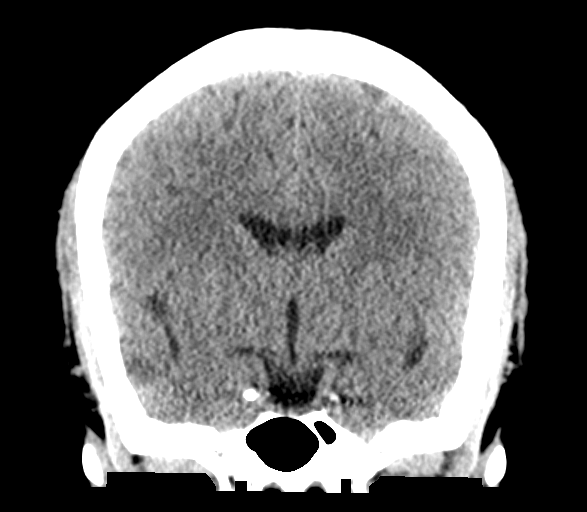

[Series 6: sagittal · sagittal · 0.29mm/px · 3 of 59 slices shown]
[im 20/59  brain]
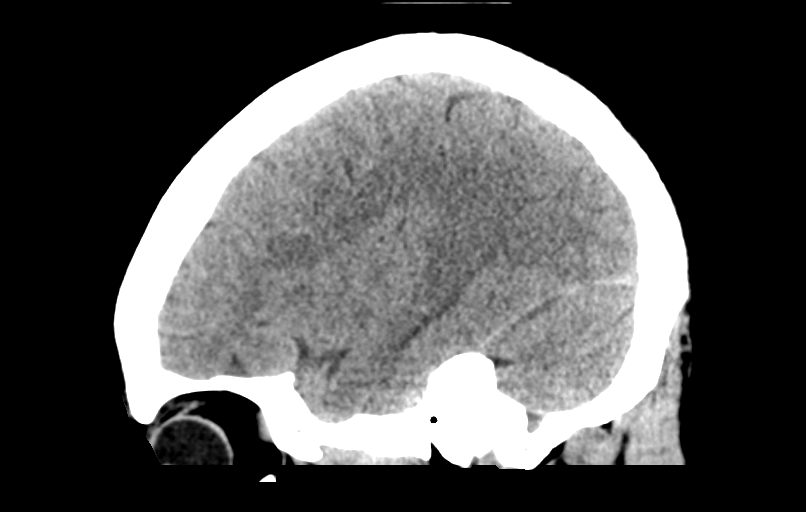
[im 30/59  brain]
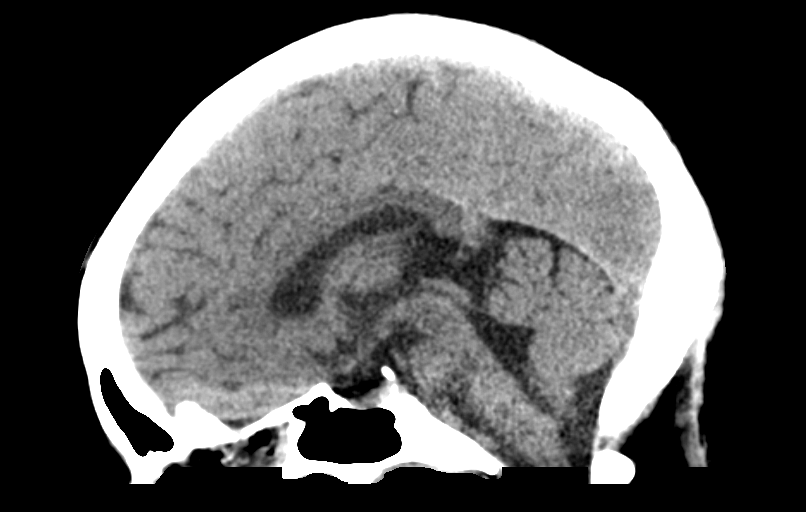
[im 39/59  brain]
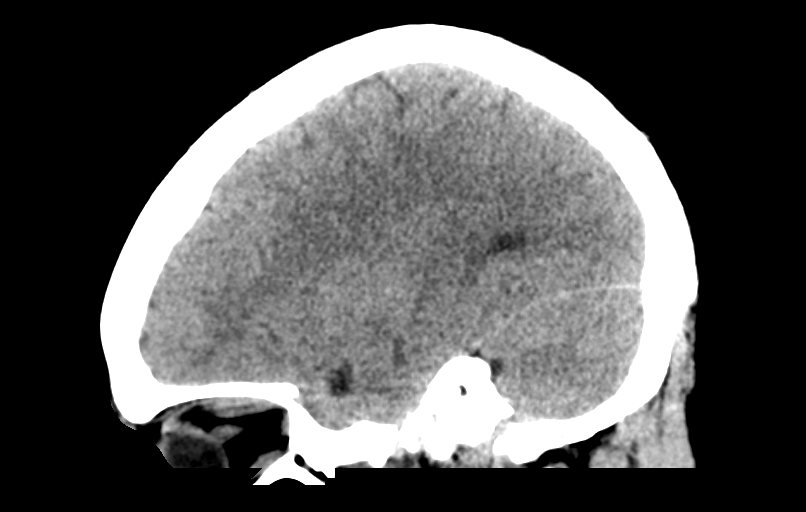

[15 of 47 positions shown; findings below may reference images not displayed]

FINDINGS: Unremarkable appearance of the calvarium without acute fracture or
aggressive lesion.

Unremarkable appearance of the scalp soft tissues.

Unremarkable appearance of the bilateral orbits.

Mastoid air cells are clear.

No significant paranasal sinus disease

No acute intracranial hemorrhage, midline shift, or mass effect.

Gray-white differentiation is maintained, without CT evidence of
acute ischemia.

Unremarkable configuration of the ventricles.
IMPRESSION: No CT evidence of acute intracranial abnormality.
